# Patient Record
Sex: Male | Born: 1980 | Race: Black or African American | Hispanic: No | Marital: Married | Smoking: Never smoker
Health system: Southern US, Community
[De-identification: ages and names within clinical notes are randomized; demographics above are authoritative.]

## PROBLEM LIST (undated history)

## (undated) DIAGNOSIS — E669 Obesity, unspecified: Secondary | ICD-10-CM

## (undated) HISTORY — DX: Obesity, unspecified: E66.9

---

## 2009-11-28 ENCOUNTER — Emergency Department (HOSPITAL_COMMUNITY): Admission: EM | Admit: 2009-11-28 | Discharge: 2009-11-28 | Payer: Self-pay | Admitting: Emergency Medicine

## 2015-02-19 ENCOUNTER — Encounter (HOSPITAL_COMMUNITY): Payer: Self-pay | Admitting: Emergency Medicine

## 2015-02-19 ENCOUNTER — Emergency Department (HOSPITAL_COMMUNITY): Payer: 59

## 2015-02-19 ENCOUNTER — Emergency Department (HOSPITAL_COMMUNITY)
Admission: EM | Admit: 2015-02-19 | Discharge: 2015-02-19 | Disposition: A | Discharge status: Home / Self Care | Payer: 59 | Attending: Emergency Medicine | Admitting: Emergency Medicine

## 2015-02-19 DIAGNOSIS — R55 Syncope and collapse: Secondary | ICD-10-CM | POA: Diagnosis not present

## 2015-02-19 DIAGNOSIS — R42 Dizziness and giddiness: Secondary | ICD-10-CM | POA: Insufficient documentation

## 2015-02-19 DIAGNOSIS — R0602 Shortness of breath: Secondary | ICD-10-CM | POA: Diagnosis not present

## 2015-02-19 LAB — COMPREHENSIVE METABOLIC PANEL
ALT: 66 U/L — ABNORMAL HIGH (ref 17–63)
AST: 36 U/L (ref 15–41)
Albumin: 4.2 g/dL (ref 3.5–5.0)
Alkaline Phosphatase: 62 U/L (ref 38–126)
Anion gap: 7 (ref 5–15)
BUN: 11 mg/dL (ref 6–20)
CHLORIDE: 102 mmol/L (ref 101–111)
CO2: 30 mmol/L (ref 22–32)
Calcium: 9.7 mg/dL (ref 8.9–10.3)
Creatinine, Ser: 0.96 mg/dL (ref 0.61–1.24)
GFR calc Af Amer: >60 mL/min (ref >60)
GLUCOSE: 157 mg/dL — AB (ref 65–99)
POTASSIUM: 3.9 mmol/L (ref 3.5–5.1)
Sodium: 139 mmol/L (ref 135–145)
TOTAL PROTEIN: 7.9 g/dL (ref 6.5–8.1)
Total Bilirubin: 0.7 mg/dL (ref 0.3–1.2)

## 2015-02-19 LAB — CBC WITH DIFFERENTIAL/PLATELET
BASOS ABS: 0 10*3/uL (ref 0.0–0.1)
BASOS PCT: 0 % (ref 0–1)
Eosinophils Absolute: 0.1 10*3/uL (ref 0.0–0.7)
Eosinophils Relative: 1 % (ref 0–5)
HCT: 42.6 % (ref 39.0–52.0)
Hemoglobin: 14.2 g/dL (ref 13.0–17.0)
Lymphocytes Relative: 29 % (ref 12–46)
Lymphs Abs: 2 10*3/uL (ref 0.7–4.0)
MCH: 30.2 pg (ref 26.0–34.0)
MCHC: 33.3 g/dL (ref 30.0–36.0)
MCV: 90.6 fL (ref 78.0–100.0)
Monocytes Absolute: 0.4 10*3/uL (ref 0.1–1.0)
Monocytes Relative: 5 % (ref 3–12)
NEUTROS ABS: 4.5 10*3/uL (ref 1.7–7.7)
NEUTROS PCT: 65 % (ref 43–77)
PLATELETS: 237 10*3/uL (ref 150–400)
RBC: 4.7 MIL/uL (ref 4.22–5.81)
RDW: 13.3 % (ref 11.5–15.5)
WBC: 6.9 10*3/uL (ref 4.0–10.5)

## 2015-02-19 LAB — URINALYSIS, ROUTINE W REFLEX MICROSCOPIC
Bilirubin Urine: NEGATIVE
Glucose, UA: 250 mg/dL — AB
Hgb urine dipstick: NEGATIVE
KETONES UR: NEGATIVE mg/dL
Leukocytes, UA: NEGATIVE
Nitrite: NEGATIVE
Protein, ur: NEGATIVE mg/dL
Specific Gravity, Urine: 1.015 (ref 1.005–1.030)
Urobilinogen, UA: 0.2 mg/dL (ref 0.0–1.0)
pH: 5.5 (ref 5.0–8.0)

## 2015-02-19 LAB — TROPONIN I: Troponin I: <0.03 ng/mL (ref <0.031)

## 2015-02-19 MED ORDER — SODIUM CHLORIDE 0.9 % IV BOLUS (SEPSIS)
1000.0000 mL | Freq: Once | INTRAVENOUS | Status: AC
  Administered 2015-02-19: 1000 mL via INTRAVENOUS

## 2015-02-19 NOTE — ED Provider Notes (Signed)
CSN: [id number]     Arrival date & time 02/19/15  1450 History   First MD Initiated Contact with Patient 02/19/15 1510     Chief Complaint  Patient presents with  . Near Syncope  . Shortness of Breath     (Consider location/radiation/quality/duration/timing/severity/associated sxs/prior Treatment) Patient is a 34 y.o. male presenting with near-syncope and shortness of breath. The history is provided by the patient (the pt was on the camode having a bowel movement when he became dizzy diaphroretic and weak. he did not pass out).  Near Syncope This is a new problem. The current episode started 1 to 2 hours ago. The problem occurs rarely. The problem has been resolved. Pertinent negatives include no chest pain, no abdominal pain and no headaches. Nothing aggravates the symptoms. Nothing relieves the symptoms.  Shortness of Breath Associated symptoms: no abdominal pain, no chest pain, no cough, no headaches and no rash     History reviewed. No pertinent past medical history. History reviewed. No pertinent past surgical history. Family History  Problem Relation Age of Onset  . Hypertension Mother    Social History  Substance Use Topics  . Smoking status: None  . Smokeless tobacco: None  . Alcohol Use: Yes     Comment: occasionally    Review of Systems  Constitutional: Negative for appetite change and fatigue.  HENT: Negative for congestion, ear discharge and sinus pressure.   Eyes: Negative for discharge.  Respiratory: Negative for cough.   Cardiovascular: Positive for near-syncope. Negative for chest pain.  Gastrointestinal: Negative for abdominal pain and diarrhea.  Genitourinary: Negative for frequency and hematuria.  Musculoskeletal: Negative for back pain.  Skin: Negative for rash.  Neurological: Positive for light-headedness. Negative for seizures and headaches.  Psychiatric/Behavioral: Negative for hallucinations.      Allergies  Review of patient's allergies  indicates no known allergies.  Home Medications   Prior to Admission medications   Not on File   BP 112/48 mmHg  Pulse 86  Temp(Src) 98.4 F (36.9 C) (Oral)  Resp 15  Ht 6' (1.829 m)  Wt 365 lb (165.563 kg)  BMI 49.49 kg/m2  SpO2 99% Physical Exam  Constitutional: He is oriented to person, place, and time. He appears well-developed.  HENT:  Head: Normocephalic.  Eyes: Conjunctivae and EOM are normal. No scleral icterus.  Neck: Neck supple. No thyromegaly present.  Cardiovascular: Normal rate and regular rhythm.  Exam reveals no gallop and no friction rub.   No murmur heard. Pulmonary/Chest: No stridor. He has no wheezes. He has no rales. He exhibits no tenderness.  Abdominal: He exhibits no distension. There is no tenderness. There is no rebound.  Musculoskeletal: Normal range of motion. He exhibits no edema.  Lymphadenopathy:    He has no cervical adenopathy.  Neurological: He is oriented to person, place, and time. He exhibits normal muscle tone. Coordination normal.  Skin: No rash noted. No erythema.  Psychiatric: He has a normal mood and affect. His behavior is normal.    ED Course  Procedures (including critical care time) Labs Review Labs Reviewed  COMPREHENSIVE METABOLIC PANEL - Abnormal; Notable for the following:    Glucose, Bld 157 (*)    ALT 66 (*)    All other components within normal limits  URINALYSIS, ROUTINE W REFLEX MICROSCOPIC (NOT AT [facility]) - Abnormal; Notable for the following:    Glucose, UA 250 (*)    All other components within normal limits  CBC WITH DIFFERENTIAL/PLATELET  TROPONIN I  Imaging Review Dg Chest 2 View  02/19/2015   CLINICAL DATA:  Near syncopal episode during bowel movement today  EXAM: CHEST  2 VIEW  COMPARISON:  None  FINDINGS: Normal heart size, mediastinal contours, and pulmonary vascularity.  Lungs clear.  No pneumothorax.  Bones unremarkable.  IMPRESSION: Normal exam.   Electronically Signed   By: Lavonia Dana M.D.   On:  02/19/2015 16:17     EKG Interpretation   Date/Time:  Wednesday February 19 2015 15:04:54 EDT Ventricular Rate:  83 PR Interval:  204 QRS Duration: 103 QT Interval:  355 QTC Calculation: 417 R Axis:   -27 Text Interpretation:  Sinus rhythm Borderline prolonged PR interval  Borderline left axis deviation nonspecific ST changes pvcs No old tracing  to compare Confirmed by CAMPOS  MD, Lennette Bihari ([id number]) on 02/19/2015 3:29:11 PM      MDM   Final diagnoses:  Near syncope    Near syncope with bowel movement,  Nl labs,  Pt hydrated and feeling better.  Doubt cardiac or pe,  Will refer to pcp for mild elevated glucose and unifocal pvc    Milton Ferguson, MD 02/19/15 [phone]

## 2015-02-19 NOTE — Discharge Instructions (Signed)
Follow up with a family md for recheck in 1-3 weeks

## 2015-02-19 NOTE — ED Notes (Signed)
Per EMS patient states patient was on the toilet and had a near syncopal episode with diaphoresis and weakness. Patient states nausea before episode. After episode patient reported shortness of breath and weakness.

## 2015-02-28 ENCOUNTER — Encounter (HOSPITAL_COMMUNITY): Payer: Self-pay

## 2015-02-28 ENCOUNTER — Emergency Department (HOSPITAL_COMMUNITY)
Admission: EM | Admit: 2015-02-28 | Discharge: 2015-03-01 | Disposition: A | Discharge status: Home / Self Care | Payer: 59 | Attending: Emergency Medicine | Admitting: Emergency Medicine

## 2015-02-28 DIAGNOSIS — R531 Weakness: Secondary | ICD-10-CM

## 2015-02-28 DIAGNOSIS — R42 Dizziness and giddiness: Secondary | ICD-10-CM | POA: Insufficient documentation

## 2015-02-28 DIAGNOSIS — R110 Nausea: Secondary | ICD-10-CM | POA: Insufficient documentation

## 2015-02-28 DIAGNOSIS — R5383 Other fatigue: Secondary | ICD-10-CM | POA: Insufficient documentation

## 2015-02-28 NOTE — ED Notes (Signed)
Pt c/o generalized body weakness, shortness of breath, dizziness and nausea.

## 2015-02-28 NOTE — ED Provider Notes (Signed)
CSN: [id number]     Arrival date & time 02/28/15  2313 History  This chart was scribed for Jola Schmidt, MD by Irene Pap, ED Scribe. This patient was seen in room APA04/APA04 and patient care was started at 11:39 PM.   Chief Complaint  Patient presents with  . Weakness   The history is provided by the patient. No language interpreter was used.   HPI Comments: Kyle Nguyen is a 34 y.o. male with a hx of HTN who presents to the Emergency Department complaining of generalized weakness onset earlier this evening. He states that he ate dinner then went to the bathroom where he began to feel dizziness, lightheadedness and SOB 5 minutes after using the bathroom; he states that the episode lasted 10 minutes. Reports associated lethargy and nausea. He states that he was seen 2 weeks ago for the same symptoms in the same situation: after using the bathroom. He says that he had blood work and x-rays performed last time but states that the symptoms resolved and did not show up again until today. Denies that he felt bad earlier, fever, vomiting, chest pain, chest tightness, diarrhea, melena, or hematochezia. States that he works in a [employer] and is not out in the heat while working.  History reviewed. No pertinent past medical history. History reviewed. No pertinent past surgical history. Family History  Problem Relation Age of Onset  . Hypertension Mother    Social History  Substance Use Topics  . Smoking status: Never Smoker   . Smokeless tobacco: None  . Alcohol Use: Yes     Comment: occasionally    Review of Systems  Constitutional: Positive for fatigue. Negative for fever.  Gastrointestinal: Positive for nausea. Negative for vomiting, diarrhea and blood in stool.  Neurological: Positive for dizziness, weakness and light-headedness.  All other systems reviewed and are negative.     Allergies  Review of patient's allergies indicates no known allergies.  Home Medications    Prior to Admission medications   Not on File   BP 125/71 mmHg  Pulse 72  Temp(Src) 98.3 F (36.8 C) (Oral)  Resp 19  Ht 6' (1.829 m)  Wt 360 lb (163.295 kg)  BMI 48.81 kg/m2  SpO2 100%  Physical Exam  Constitutional: He is oriented to person, place, and time. He appears well-developed and well-nourished.  HENT:  Head: Normocephalic and atraumatic.  Right Ear: External ear normal.  Left Ear: External ear normal.  Nose: Nose normal.  Mouth/Throat: Oropharynx is clear and moist.  Eyes: Conjunctivae and EOM are normal. Pupils are equal, round, and reactive to light.  Neck: Normal range of motion. Neck supple.  Cardiovascular: Normal rate, regular rhythm, normal heart sounds and intact distal pulses.   Pulmonary/Chest: Effort normal and breath sounds normal.  Abdominal: Soft. Bowel sounds are normal.  Musculoskeletal: Normal range of motion.  Neurological: He is alert and oriented to person, place, and time. He has normal reflexes.  Skin: Skin is warm and dry.  Psychiatric: He has a normal mood and affect. His behavior is normal. Judgment and thought content normal.  Nursing note and vitals reviewed.   ED Course  Procedures (including critical care time) DIAGNOSTIC STUDIES: Oxygen Saturation is 100% on RA, normal by my interpretation.    COORDINATION OF CARE: 11:42 PM-Discussed treatment plan which includes labs with pt at bedside and pt agreed to plan.   Labs Review Labs Reviewed  CBC WITH DIFFERENTIAL/PLATELET - Abnormal; Notable for the following:  Hemoglobin 12.9 (*)    HCT 38.3 (*)    All other components within normal limits  BASIC METABOLIC PANEL - Abnormal; Notable for the following:    Glucose, Bld 105 (*)    Calcium 8.8 (*)    All other components within normal limits    Imaging Review No results found.   EKG Interpretation   Date/Time:  Friday February 28 2015 23:20:01 EDT Ventricular Rate:  69 PR Interval:  212 QRS Duration: 112 QT Interval:   376 QTC Calculation: 403 R Axis:   -32 Text Interpretation:  Sinus rhythm Prolonged PR interval Borderline IVCD  with LAD ST elev, probable normal early repol pattern No significant  change was found Confirmed by Kden Wagster  MD, Daymeon Fischman ([id number]) on 02/28/2015  11:52:17 PM      MDM   Final diagnoses:  None    Generalized weakness.  No preceding palpitations or chest pain.  EKG is nonischemic.  No syncope.  Patient will need primary care follow-up.  Labs without significant abnormality.  Discharge home in good condition.  Symptoms have completely resolved at this time.   I personally performed the services described in this documentation, which was scribed in my presence. The recorded information has been reviewed and is accurate.      Jola Schmidt, MD 03/01/15 [phone]

## 2015-03-01 LAB — BASIC METABOLIC PANEL
ANION GAP: 7 (ref 5–15)
BUN: 14 mg/dL (ref 6–20)
CO2: 28 mmol/L (ref 22–32)
Calcium: 8.8 mg/dL — ABNORMAL LOW (ref 8.9–10.3)
Chloride: 102 mmol/L (ref 101–111)
Creatinine, Ser: 1.06 mg/dL (ref 0.61–1.24)
GFR calc Af Amer: >60 mL/min (ref >60)
GFR calc non Af Amer: >60 mL/min (ref >60)
GLUCOSE: 105 mg/dL — AB (ref 65–99)
Potassium: 3.8 mmol/L (ref 3.5–5.1)
Sodium: 137 mmol/L (ref 135–145)

## 2015-03-01 LAB — CBC WITH DIFFERENTIAL/PLATELET
BASOS ABS: 0 10*3/uL (ref 0.0–0.1)
Basophils Relative: 0 % (ref 0–1)
Eosinophils Absolute: 0.1 10*3/uL (ref 0.0–0.7)
Eosinophils Relative: 2 % (ref 0–5)
HEMATOCRIT: 38.3 % — AB (ref 39.0–52.0)
Hemoglobin: 12.9 g/dL — ABNORMAL LOW (ref 13.0–17.0)
Lymphocytes Relative: 36 % (ref 12–46)
Lymphs Abs: 2.5 10*3/uL (ref 0.7–4.0)
MCH: 30.4 pg (ref 26.0–34.0)
MCHC: 33.7 g/dL (ref 30.0–36.0)
MCV: 90.3 fL (ref 78.0–100.0)
Monocytes Absolute: 0.5 10*3/uL (ref 0.1–1.0)
Monocytes Relative: 8 % (ref 3–12)
NEUTROS ABS: 3.8 10*3/uL (ref 1.7–7.7)
Neutrophils Relative %: 54 % (ref 43–77)
Platelets: 262 10*3/uL (ref 150–400)
RBC: 4.24 MIL/uL (ref 4.22–5.81)
RDW: 13.5 % (ref 11.5–15.5)
WBC: 6.9 10*3/uL (ref 4.0–10.5)

## 2015-03-03 ENCOUNTER — Encounter (HOSPITAL_COMMUNITY): Payer: Self-pay | Admitting: Emergency Medicine

## 2015-03-03 DIAGNOSIS — R0602 Shortness of breath: Secondary | ICD-10-CM | POA: Diagnosis not present

## 2015-03-03 DIAGNOSIS — R112 Nausea with vomiting, unspecified: Secondary | ICD-10-CM | POA: Diagnosis not present

## 2015-03-03 DIAGNOSIS — R079 Chest pain, unspecified: Secondary | ICD-10-CM | POA: Diagnosis not present

## 2015-03-03 DIAGNOSIS — I10 Essential (primary) hypertension: Secondary | ICD-10-CM | POA: Diagnosis not present

## 2015-03-03 DIAGNOSIS — R197 Diarrhea, unspecified: Secondary | ICD-10-CM | POA: Insufficient documentation

## 2015-03-03 NOTE — ED Notes (Signed)
Pt reports sob, L upper cp and nv x1. sts he has been seen for same over past 2 weeks. Skin warm and dry. resp e/u.

## 2015-03-04 ENCOUNTER — Emergency Department (HOSPITAL_COMMUNITY)
Admission: EM | Admit: 2015-03-04 | Discharge: 2015-03-04 | Disposition: A | Discharge status: Home / Self Care | Payer: 59 | Attending: Emergency Medicine | Admitting: Emergency Medicine

## 2015-03-04 ENCOUNTER — Emergency Department (HOSPITAL_COMMUNITY): Payer: 59

## 2015-03-04 DIAGNOSIS — R112 Nausea with vomiting, unspecified: Secondary | ICD-10-CM

## 2015-03-04 DIAGNOSIS — R079 Chest pain, unspecified: Secondary | ICD-10-CM

## 2015-03-04 LAB — CBC
HEMATOCRIT: 41.6 % (ref 39.0–52.0)
HEMOGLOBIN: 14.3 g/dL (ref 13.0–17.0)
MCH: 30.5 pg (ref 26.0–34.0)
MCHC: 34.4 g/dL (ref 30.0–36.0)
MCV: 88.7 fL (ref 78.0–100.0)
Platelets: 288 10*3/uL (ref 150–400)
RBC: 4.69 MIL/uL (ref 4.22–5.81)
RDW: 13.5 % (ref 11.5–15.5)
WBC: 6.7 10*3/uL (ref 4.0–10.5)

## 2015-03-04 LAB — BASIC METABOLIC PANEL
Anion gap: 10 (ref 5–15)
BUN: 11 mg/dL (ref 6–20)
CO2: 23 mmol/L (ref 22–32)
Calcium: 9.8 mg/dL (ref 8.9–10.3)
Chloride: 104 mmol/L (ref 101–111)
Creatinine, Ser: 0.93 mg/dL (ref 0.61–1.24)
GLUCOSE: 92 mg/dL (ref 65–99)
POTASSIUM: 3.9 mmol/L (ref 3.5–5.1)
Sodium: 137 mmol/L (ref 135–145)

## 2015-03-04 LAB — D-DIMER, QUANTITATIVE (NOT AT ARMC)

## 2015-03-04 LAB — TROPONIN I: Troponin I: <0.03 ng/mL (ref <0.031)

## 2015-03-04 NOTE — ED Notes (Signed)
Dr. Wickline at bedside.  

## 2015-03-04 NOTE — Discharge Instructions (Signed)

## 2015-03-04 NOTE — ED Provider Notes (Signed)
CSN: [id number]   Arrival date & time 03/03/15 2351  History  This chart was scribed for Kyle Fraise, MD by Altamease Oiler, ED Scribe. This patient was seen in room A10C/A10C and the patient's care was started at 3:17 AM.  Chief Complaint  Patient presents with  . Shortness of Breath  . Nausea    HPI Patient is a 34 y.o. male presenting with chest pain. The history is provided by the patient. No language interpreter was used.  Chest Pain Pain location:  L chest Pain radiates to:  Does not radiate Pain radiates to the back: no   Pain severity:  Moderate Onset quality:  Sudden Duration:  30 minutes Progression:  Resolved Chronicity:  New Context: not breathing   Relieved by:  Nothing Worsened by:  Nothing tried Ineffective treatments:  None tried Associated symptoms: nausea, shortness of breath and vomiting   Associated symptoms: no abdominal pain, no dizziness, no fever, no near-syncope and no syncope    Atthew Nguyen is a 34 y.o. male with PMHx of HTN who presents to the Emergency Department complaining of an episode of left-sided chest pain with onset around 10 PM.  He describes the pain as "like swimming under water". The episode lasted for 30 minutes and has not re-occurred. Associated symptoms include SOB, nausea (with onset around 9 PM prior to the onset of pain) , 1 episode of emesis, and diarrhea. Pt denies pleuritic chest pain, dizziness, syncope, hematemesis, hematochezia, syncope, and abdominal pain. No daily medication. No history of DVT/PE or MI. No recent travel or surgery. No family history of early unexplained death. He has a scheduled PCP appointment on 03/11/15.  PMH - none Family History  Problem Relation Age of Onset  . Hypertension Mother     Social History  Substance Use Topics  . Smoking status: Never Smoker   . Smokeless tobacco: None  . Alcohol Use: Yes     Comment: occasionally     Review of Systems  Constitutional: Negative for fever.   Respiratory: Positive for shortness of breath.   Cardiovascular: Positive for chest pain. Negative for syncope and near-syncope.  Gastrointestinal: Positive for nausea and vomiting. Negative for abdominal pain.  Neurological: Negative for dizziness.  All other systems reviewed and are negative.   Home Medications   Prior to Admission medications   Not on File    Allergies  Review of patient's allergies indicates no known allergies.  Triage Vitals: BP 129/51 mmHg  Pulse 62  Temp(Src) 98.7 F (37.1 C) (Oral)  Resp 12  Ht 6' (1.829 m)  Wt 363 lb (164.656 kg)  BMI 49.22 kg/m2  SpO2 94%  Physical Exam  Nursing note and vitals reviewed. CONSTITUTIONAL: Well developed/well nourished HEAD: Normocephalic/atraumatic EYES: EOMI/PERRL ENMT: Mucous membranes moist NECK: supple no meningeal signs SPINE/BACK:entire spine nontender CV: S1/S2 noted, no murmurs/rubs/gallops noted LUNGS: Lungs are clear to auscultation bilaterally, no apparent distress ABDOMEN: soft, nontender, no rebound or guarding, bowel sounds noted throughout abdomen GU:no cva tenderness NEURO: Pt is awake/alert/appropriate, moves all extremitiesx4.  No facial droop.   EXTREMITIES: pulses normal/equal, full ROM SKIN: warm, color normal PSYCH: no abnormalities of mood noted, alert and oriented to situation  ED Course  Procedures   DIAGNOSTIC STUDIES: Oxygen Saturation is 94% on RA, normal by my interpretation.    COORDINATION OF CARE: 3:27 AM Discussed treatment plan which includes CXR, EKG, and lab work with pt at bedside and pt agreed to plan.  Pt well appearing He has  had multiple ED visits, but with different complaints (had near syncope on previous visit) His CP was several hours prior with negative troponin D-dimer negative I doubt ACS/PE/Dissection at this time I feel he is safe for d/c home He has no high risk features for dysrhythmia He has PCP followup Discussed strict return  precautions   Labs Reviewed  BASIC METABOLIC PANEL  CBC  TROPONIN I  D-DIMER, QUANTITATIVE (NOT AT [facility])    I, Kyle Fraise, MD, personally reviewed and evaluated these images and lab results as part of my medical decision-making.  Imaging Review Dg Chest 2 View  03/04/2015   CLINICAL DATA:  Mid chest pain and shortness of breath. Nausea and vomiting. Symptoms for 1 day.  EXAM: CHEST  2 VIEW  COMPARISON:  02/19/2015  FINDINGS: The cardiomediastinal contours are normal. The lungs are clear. Pulmonary vasculature is normal. No consolidation, pleural effusion, or pneumothorax. No acute osseous abnormalities are seen.  IMPRESSION: No acute pulmonary process.   Electronically Signed   By: Jeb Levering M.D.   On: 03/04/2015 01:02    EKG Interpretation  Date/Time:  Monday March 03 2015 23:58:04 EDT Ventricular Rate:  76 PR Interval:  194 QRS Duration: 98 QT Interval:  364 QTC Calculation: 409 R Axis:   7 Text Interpretation:  Normal sinus rhythm Incomplete right bundle branch block Nonspecific T wave abnormality Abnormal ECG No significant change since last tracing Confirmed by Christy Gentles  MD, Elenore Rota ([id number]) on 03/04/2015 2:31:55 AM       MDM   Final diagnoses:  Chest pain, unspecified chest pain type  Non-intractable vomiting with nausea, vomiting of unspecified type     Nursing notes including past medical history and social history reviewed and considered in documentation xrays/imaging reviewed by myself and considered during evaluation Labs/vital reviewed myself and considered during evaluation Previous records reviewed and considered   I personally performed the services described in this documentation, which was scribed in my presence. The recorded information has been reviewed and is accurate.     Kyle Fraise, MD 03/04/15 [phone]

## 2015-03-04 NOTE — ED Notes (Signed)
Discharge instructions reviewed with patient/spouse. Understanding verbalized. Patient refused wheelchair for transport at time of discharge. Denies pain. No distress noted.

## 2015-03-04 NOTE — ED Notes (Signed)
Phlebotomy at bedside.

## 2015-03-25 ENCOUNTER — Other Ambulatory Visit: Payer: Self-pay | Admitting: Internal Medicine

## 2015-03-25 DIAGNOSIS — R945 Abnormal results of liver function studies: Principal | ICD-10-CM

## 2015-03-25 DIAGNOSIS — R7989 Other specified abnormal findings of blood chemistry: Secondary | ICD-10-CM

## 2015-03-28 ENCOUNTER — Ambulatory Visit
Admission: RE | Admit: 2015-03-28 | Discharge: 2015-03-28 | Disposition: A | Discharge status: Home / Self Care | Payer: 59 | Source: Ambulatory Visit | Attending: Internal Medicine | Admitting: Internal Medicine

## 2015-03-28 DIAGNOSIS — R7989 Other specified abnormal findings of blood chemistry: Secondary | ICD-10-CM

## 2015-03-28 DIAGNOSIS — R945 Abnormal results of liver function studies: Principal | ICD-10-CM

## 2015-07-28 ENCOUNTER — Emergency Department (HOSPITAL_COMMUNITY)
Admission: EM | Admit: 2015-07-28 | Discharge: 2015-07-29 | Disposition: A | Discharge status: Home / Self Care | Payer: 59 | Attending: Emergency Medicine | Admitting: Emergency Medicine

## 2015-07-28 ENCOUNTER — Encounter (HOSPITAL_COMMUNITY): Payer: Self-pay | Admitting: Emergency Medicine

## 2015-07-28 DIAGNOSIS — R112 Nausea with vomiting, unspecified: Secondary | ICD-10-CM | POA: Diagnosis present

## 2015-07-28 DIAGNOSIS — R1084 Generalized abdominal pain: Secondary | ICD-10-CM | POA: Diagnosis not present

## 2015-07-28 NOTE — ED Notes (Signed)
Patient brought in by EMS with complaint of abdominal pain and vomiting starting at 1830 tonight.

## 2015-07-29 ENCOUNTER — Emergency Department (HOSPITAL_COMMUNITY): Payer: 59

## 2015-07-29 LAB — COMPREHENSIVE METABOLIC PANEL
ALK PHOS: 68 U/L (ref 38–126)
ALT: 52 U/L (ref 17–63)
AST: 44 U/L — ABNORMAL HIGH (ref 15–41)
Albumin: 4.6 g/dL (ref 3.5–5.0)
Anion gap: 13 (ref 5–15)
BILIRUBIN TOTAL: 0.9 mg/dL (ref 0.3–1.2)
BUN: 14 mg/dL (ref 6–20)
CALCIUM: 9.7 mg/dL (ref 8.9–10.3)
CO2: 24 mmol/L (ref 22–32)
CREATININE: 1.08 mg/dL (ref 0.61–1.24)
Chloride: 102 mmol/L (ref 101–111)
Glucose, Bld: 136 mg/dL — ABNORMAL HIGH (ref 65–99)
Potassium: 3.3 mmol/L — ABNORMAL LOW (ref 3.5–5.1)
Sodium: 139 mmol/L (ref 135–145)
TOTAL PROTEIN: 8.6 g/dL — AB (ref 6.5–8.1)

## 2015-07-29 LAB — CBC WITH DIFFERENTIAL/PLATELET
BASOS ABS: 0 10*3/uL (ref 0.0–0.1)
Basophils Relative: 0 %
EOS PCT: 0 %
Eosinophils Absolute: 0 10*3/uL (ref 0.0–0.7)
HEMATOCRIT: 44.9 % (ref 39.0–52.0)
HEMOGLOBIN: 14.9 g/dL (ref 13.0–17.0)
LYMPHS PCT: 23 %
Lymphs Abs: 1.9 10*3/uL (ref 0.7–4.0)
MCH: 29.4 pg (ref 26.0–34.0)
MCHC: 33.2 g/dL (ref 30.0–36.0)
MCV: 88.7 fL (ref 78.0–100.0)
Monocytes Absolute: 0.5 10*3/uL (ref 0.1–1.0)
Monocytes Relative: 6 %
NEUTROS ABS: 5.6 10*3/uL (ref 1.7–7.7)
Neutrophils Relative %: 71 %
Platelets: 248 10*3/uL (ref 150–400)
RBC: 5.06 MIL/uL (ref 4.22–5.81)
RDW: 13.5 % (ref 11.5–15.5)
WBC: 8.1 10*3/uL (ref 4.0–10.5)

## 2015-07-29 LAB — URINALYSIS, ROUTINE W REFLEX MICROSCOPIC
BILIRUBIN URINE: NEGATIVE
Glucose, UA: NEGATIVE mg/dL
KETONES UR: NEGATIVE mg/dL
Leukocytes, UA: NEGATIVE
Nitrite: NEGATIVE
PROTEIN: 30 mg/dL — AB
SPECIFIC GRAVITY, URINE: 1.025 (ref 1.005–1.030)
pH: 6.5 (ref 5.0–8.0)

## 2015-07-29 LAB — URINE MICROSCOPIC-ADD ON

## 2015-07-29 LAB — LIPASE, BLOOD: LIPASE: 14 U/L (ref 11–51)

## 2015-07-29 MED ORDER — ONDANSETRON HCL 4 MG/2ML IJ SOLN
4.0000 mg | Freq: Once | INTRAMUSCULAR | Status: AC
  Administered 2015-07-29: 4 mg via INTRAVENOUS
  Filled 2015-07-29: qty 2

## 2015-07-29 MED ORDER — ONDANSETRON HCL 4 MG PO TABS: 4.0000 mg | ORAL_TABLET | Freq: Four times a day (QID) | ORAL | Status: DC

## 2015-07-29 MED ORDER — DIPHENOXYLATE-ATROPINE 2.5-0.025 MG PO TABS: 2.0000 | ORAL_TABLET | Freq: Four times a day (QID) | ORAL | Status: DC | PRN

## 2015-07-29 MED ORDER — SODIUM CHLORIDE 0.9 % IV BOLUS (SEPSIS)
1000.0000 mL | Freq: Once | INTRAVENOUS | Status: AC
  Administered 2015-07-29: 1000 mL via INTRAVENOUS

## 2015-07-29 NOTE — Discharge Instructions (Signed)
Abdominal Pain, Adult °Many things can cause abdominal pain. Usually, abdominal pain is not caused by a disease and will improve without treatment. It can often be observed and treated at home. Your health care provider will do a physical exam and possibly order blood tests and X-rays to help determine the seriousness of your pain. However, in many cases, more time must pass before a clear cause of the pain can be found. Before that point, your health care provider may not know if you need more testing or further treatment. °HOME CARE INSTRUCTIONS °Monitor your abdominal pain for any changes. The following actions may help to alleviate any discomfort you are experiencing: °· Only take over-the-counter or prescription medicines as directed by your health care provider. °· Do not take laxatives unless directed to do so by your health care provider. °· Try a clear liquid diet (broth, tea, or water) as directed by your health care provider. Slowly move to a bland diet as tolerated. °SEEK MEDICAL CARE IF: °· You have unexplained abdominal pain. °· You have abdominal pain associated with nausea or diarrhea. °· You have pain when you urinate or have a bowel movement. °· You experience abdominal pain that wakes you in the night. °· You have abdominal pain that is worsened or improved by eating food. °· You have abdominal pain that is worsened with eating fatty foods. °· You have a fever. °SEEK IMMEDIATE MEDICAL CARE IF: °· Your pain does not go away within 2 hours. °· You keep throwing up (vomiting). °· Your pain is felt only in portions of the abdomen, such as the right side or the left lower portion of the abdomen. °· You pass bloody or black tarry stools. °MAKE SURE YOU: °· Understand these instructions. °· Will watch your condition. °· Will get help right away if you are not doing well or get worse. °  °This information is not intended to replace advice given to you by your health care provider. Make sure you discuss  any questions you have with your health care provider. °  °Document Released: 04/07/2005 Document Revised: 03/19/2015 Document Reviewed: 03/07/2013 °Elsevier Interactive Patient Education ©2016 [employer] ° °Nausea and Vomiting °Nausea is a sick feeling that often comes before throwing up (vomiting). Vomiting is a reflex where stomach contents come out of your mouth. Vomiting can cause severe loss of body fluids (dehydration). Children and elderly adults can become dehydrated quickly, especially if they also have diarrhea. Nausea and vomiting are symptoms of a condition or disease. It is important to find the cause of your symptoms. °CAUSES  °· Direct irritation of the stomach lining. This irritation can result from increased acid production (gastroesophageal reflux disease), infection, food poisoning, taking certain medicines (such as nonsteroidal anti-inflammatory drugs), alcohol use, or tobacco use. °· Signals from the brain. These signals could be caused by a headache, heat exposure, an inner ear disturbance, increased pressure in the brain from injury, infection, a tumor, or a concussion, pain, emotional stimulus, or metabolic problems. °· An obstruction in the gastrointestinal tract (bowel obstruction). °· Illnesses such as diabetes, hepatitis, gallbladder problems, appendicitis, kidney problems, cancer, sepsis, atypical symptoms of a heart attack, or eating disorders. °· Medical treatments such as chemotherapy and radiation. °· Receiving medicine that makes you sleep (general anesthetic) during surgery. °DIAGNOSIS °Your caregiver may ask for tests to be done if the problems do not improve after a few days. Tests may also be done if symptoms are severe or if the reason for the   nausea and vomiting is not clear. Tests may include: °· Urine tests. °· Blood tests. °· Stool tests. °· Cultures (to look for evidence of infection). °· X-rays or other imaging studies. °Test results can help your caregiver make  decisions about treatment or the need for additional tests. °TREATMENT °You need to stay well hydrated. Drink frequently but in small amounts. You may wish to drink water, sports drinks, clear broth, or eat frozen ice pops or gelatin dessert to help stay hydrated. When you eat, eating slowly may help prevent nausea. There are also some antinausea medicines that may help prevent nausea. °HOME CARE INSTRUCTIONS  °· Take all medicine as directed by your caregiver. °· If you do not have an appetite, do not force yourself to eat. However, you must continue to drink fluids. °· If you have an appetite, eat a normal diet unless your caregiver tells you differently. °¨ Eat a variety of complex carbohydrates (rice, wheat, potatoes, bread), lean meats, yogurt, fruits, and vegetables. °¨ Avoid high-fat foods because they are more difficult to digest. °· Drink enough water and fluids to keep your urine clear or pale yellow. °· If you are dehydrated, ask your caregiver for specific rehydration instructions. Signs of dehydration may include: °¨ Severe thirst. °¨ Dry lips and mouth. °¨ Dizziness. °¨ Dark urine. °¨ Decreasing urine frequency and amount. °¨ Confusion. °¨ Rapid breathing or pulse. °SEEK IMMEDIATE MEDICAL CARE IF:  °· You have blood or brown flecks (like coffee grounds) in your vomit. °· You have black or bloody stools. °· You have a severe headache or stiff neck. °· You are confused. °· You have severe abdominal pain. °· You have chest pain or trouble breathing. °· You do not urinate at least once every 8 hours. °· You develop cold or clammy skin. °· You continue to vomit for longer than 24 to 48 hours. °· You have a fever. °MAKE SURE YOU:  °· Understand these instructions. °· Will watch your condition. °· Will get help right away if you are not doing well or get worse. °  °This information is not intended to replace advice given to you by your health care provider. Make sure you discuss any questions you have with  your health care provider. °  °Document Released: 06/28/2005 Document Revised: 09/20/2011 Document Reviewed: 11/25/2010 °Elsevier Interactive Patient Education ©2016 [employer] ° °

## 2015-07-29 NOTE — ED Provider Notes (Signed)
CSN: [id number]     Arrival date & time 07/28/15  2327 History  By signing my name below, I, [employer] [facility], attest that this documentation has been prepared under the direction and in the presence of Orpah Greek, MD. Electronically Signed: Virgel Bouquet, ED Scribe. 07/29/2015. 12:21 AM.   Chief Complaint  Patient presents with  . Abdominal Pain  . Emesis   The history is provided by the patient. No language interpreter was used.    HPI Comments: Kyle Nguyen is a 35 y.o. male with no hx of pertinent chronic conditions who presents to the Emergency Department complaining of moderate, unchanging, generalized aching abdominal pain that began 5.5 hours ago. He endorses associated nausea and vomiting. He denies cough, sore throat, sinus congestion, and diarrhea.  History reviewed. No pertinent past medical history. History reviewed. No pertinent past surgical history. Family History  Problem Relation Age of Onset  . Hypertension Mother    Social History  Substance Use Topics  . Smoking status: Never Smoker   . Smokeless tobacco: None  . Alcohol Use: Yes     Comment: occasionally    Review of Systems  HENT: Negative for congestion and sore throat.   Respiratory: Negative for cough.   Gastrointestinal: Positive for nausea, vomiting and abdominal pain. Negative for diarrhea.  All other systems reviewed and are negative.     Allergies  Review of patient's allergies indicates no known allergies.  Home Medications   Prior to Admission medications   Not on File   BP 118/45 mmHg  Pulse 120  Temp(Src) 97.5 F (36.4 C) (Oral)  Resp 18  Ht 6' (1.829 m)  Wt 350 lb (158.759 kg)  BMI 47.46 kg/m2  SpO2 100% Physical Exam  Constitutional: He is oriented to person, place, and time. He appears well-developed and well-nourished. No distress.  HENT:  Head: Normocephalic and atraumatic.  Right Ear: Hearing normal.  Left Ear: Hearing normal.  Nose: Nose normal.   Mouth/Throat: Oropharynx is clear and moist and mucous membranes are normal.  Eyes: Conjunctivae and EOM are normal. Pupils are equal, round, and reactive to light.  Neck: Normal range of motion. Neck supple.  Cardiovascular: Regular rhythm, S1 normal and S2 normal.  Exam reveals no gallop and no friction rub.   No murmur heard. Pulmonary/Chest: Effort normal and breath sounds normal. No respiratory distress. He exhibits no tenderness.  Abdominal: Soft. Normal appearance and bowel sounds are normal. There is no hepatosplenomegaly. There is tenderness. There is no rebound, no guarding, no tenderness at McBurney's point and negative Murphy's sign. No hernia.  Mild diffuse abdominal tenderness.  Musculoskeletal: Normal range of motion.  Neurological: He is alert and oriented to person, place, and time. He has normal strength. No cranial nerve deficit or sensory deficit. Coordination normal. GCS eye subscore is 4. GCS verbal subscore is 5. GCS motor subscore is 6.  Skin: Skin is warm, dry and intact. No rash noted. No cyanosis.  Psychiatric: He has a normal mood and affect. His speech is normal and behavior is normal. Thought content normal.  Nursing note and vitals reviewed.   ED Course  Procedures   DIAGNOSTIC STUDIES: Oxygen Saturation is 100% on RA, normal by my interpretation.    COORDINATION OF CARE: 12:03 AM Will adminster IV fluids and order chest x-ray and labs. Discussed treatment plan with pt at bedside and pt agreed to plan.  Labs Review Labs Reviewed  CBC WITH DIFFERENTIAL/PLATELET  COMPREHENSIVE METABOLIC PANEL  LIPASE, BLOOD  URINALYSIS, ROUTINE W REFLEX MICROSCOPIC (NOT AT [facility])    Imaging Review No results found. I have personally reviewed and evaluated these images and lab results as part of my medical decision-making.   EKG Interpretation None      MDM   Final diagnoses:  None   abdominal pain Vomiting  Patient presents to the emergency department for  evaluation of acute onset nausea, vomiting with abdominal pain. Patient has a benign exam, abdominal exam does not suggest an acute surgical process. Lab work is unremarkable. Patient feeling much better after symptomatic treatment. He is appropriate for outpatient management, return if symptoms worsen.  I personally performed the services described in this documentation, which was scribed in my presence. The recorded information has been reviewed and is accurate.    Orpah Greek, MD 07/29/15 [phone]

## 2016-09-29 DIAGNOSIS — Z713 Dietary counseling and surveillance: Secondary | ICD-10-CM | POA: Diagnosis not present

## 2016-09-29 DIAGNOSIS — K760 Fatty (change of) liver, not elsewhere classified: Secondary | ICD-10-CM | POA: Diagnosis not present

## 2017-04-01 DIAGNOSIS — E785 Hyperlipidemia, unspecified: Secondary | ICD-10-CM | POA: Diagnosis not present

## 2017-04-01 DIAGNOSIS — Z0000 Encounter for general adult medical examination without abnormal findings: Secondary | ICD-10-CM | POA: Diagnosis not present

## 2017-04-01 DIAGNOSIS — R7303 Prediabetes: Secondary | ICD-10-CM | POA: Diagnosis not present

## 2017-06-30 IMAGING — DX DG CHEST 2V
2 series · 2 of 2 positions shown · non-contrast
Comparison: None

CLINICAL DATA: Near syncopal episode during bowel movement today

EXAM:
CHEST  2 VIEW

[chest pa]
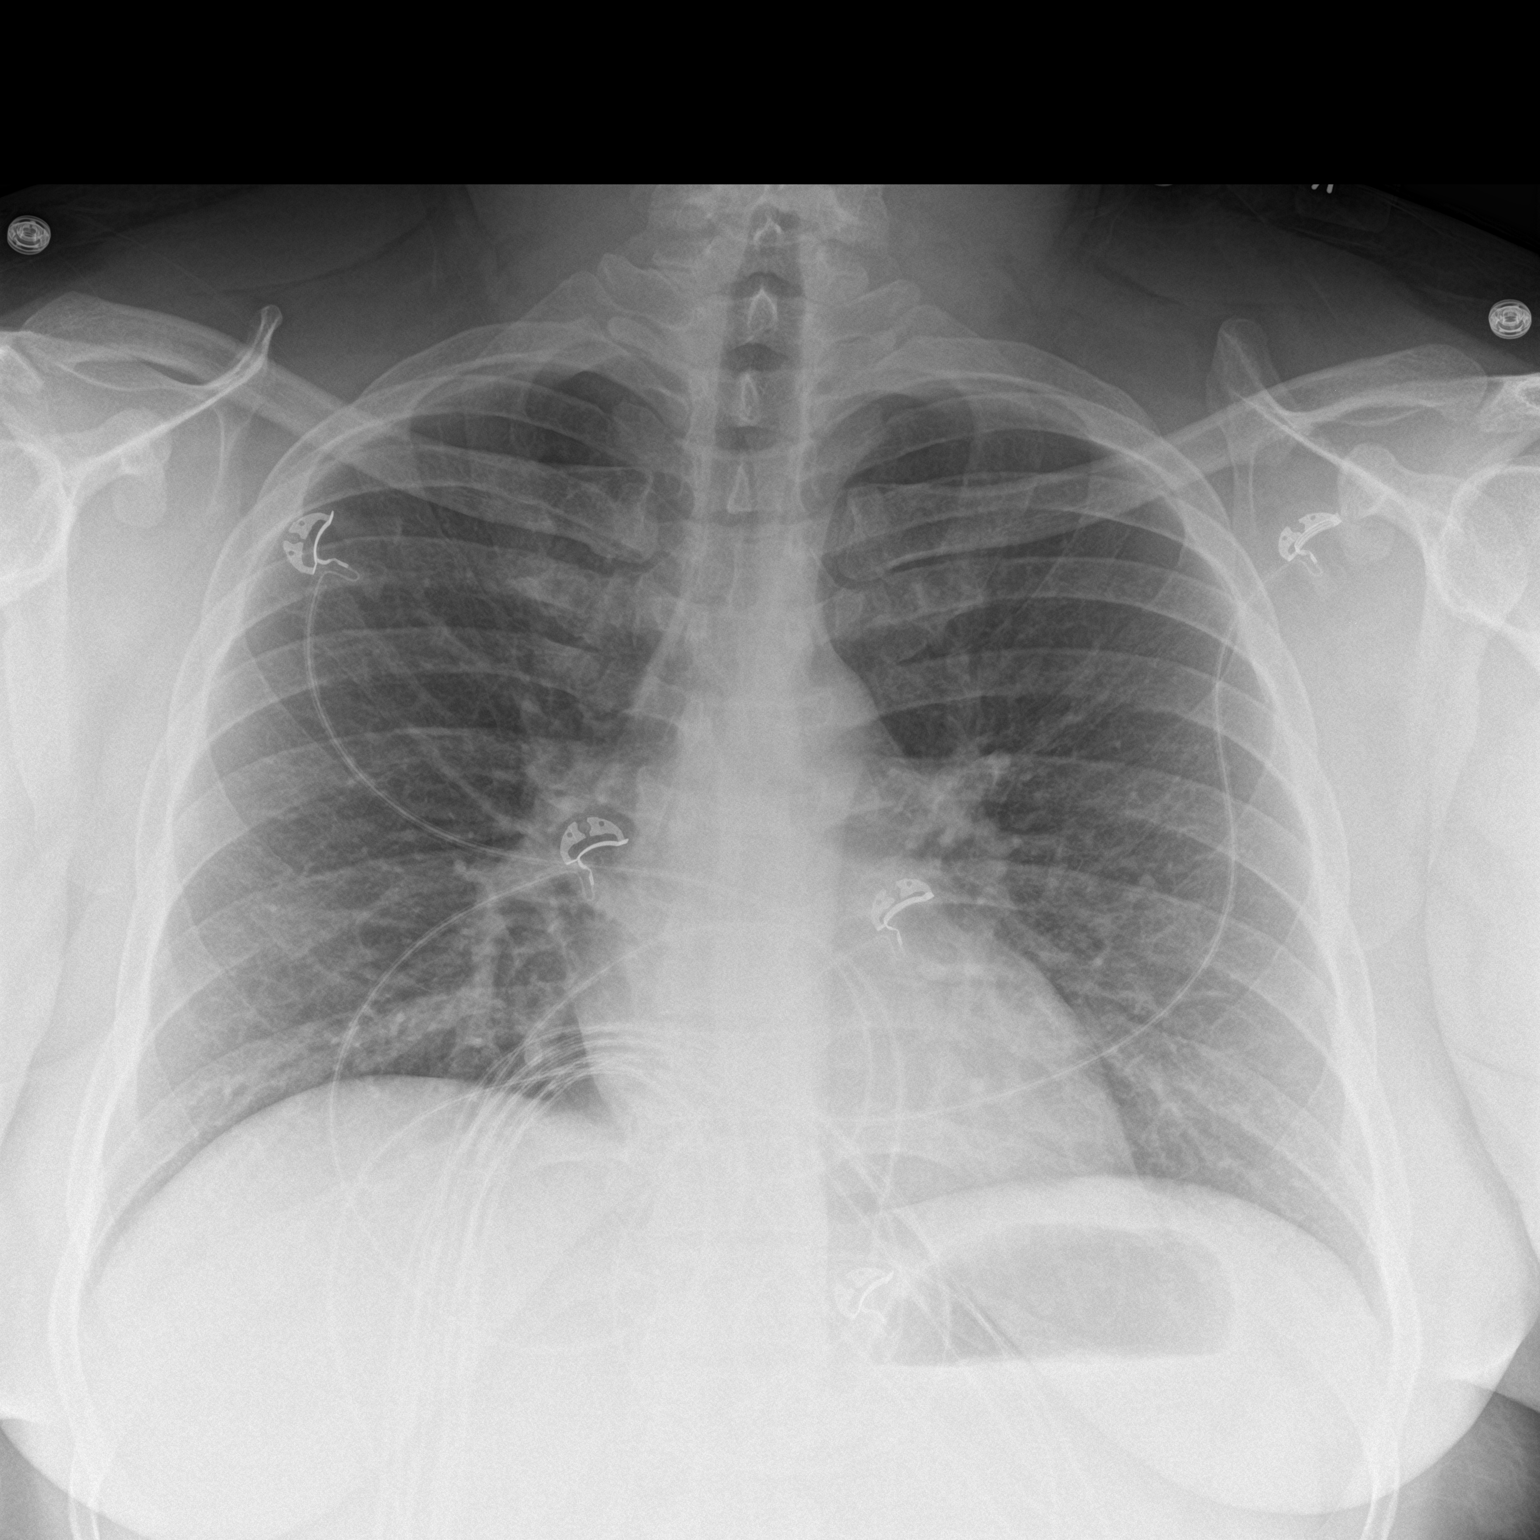

[chest lat]
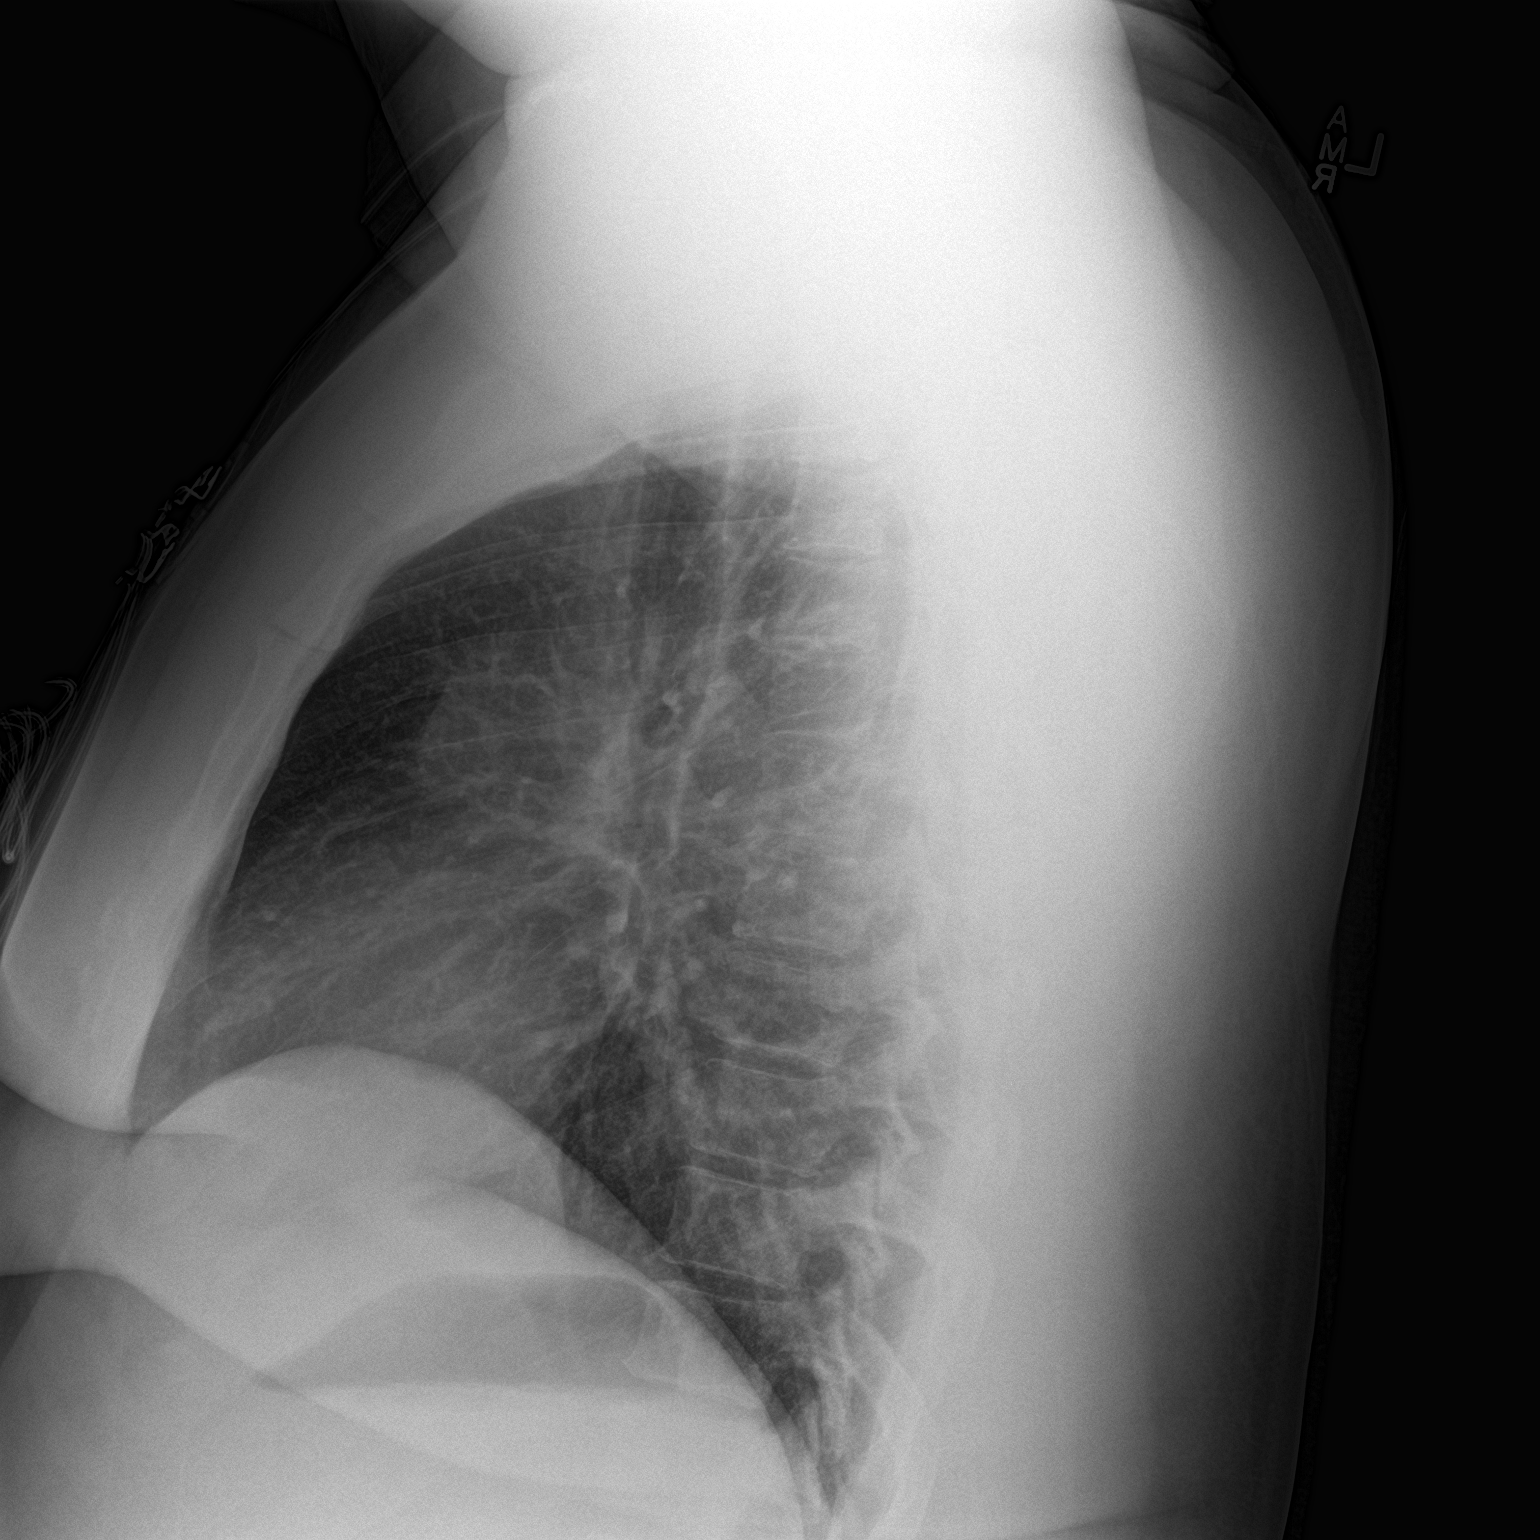

[2 of 2 positions shown; findings below may reference images not displayed]

FINDINGS: Normal heart size, mediastinal contours, and pulmonary vascularity.

Lungs clear.

No pneumothorax.

Bones unremarkable.
IMPRESSION: Normal exam.

## 2017-09-30 DIAGNOSIS — R7303 Prediabetes: Secondary | ICD-10-CM | POA: Diagnosis not present

## 2017-09-30 DIAGNOSIS — K760 Fatty (change of) liver, not elsewhere classified: Secondary | ICD-10-CM | POA: Diagnosis not present

## 2017-11-21 ENCOUNTER — Encounter: Payer: Self-pay | Admitting: Nutrition

## 2017-11-21 ENCOUNTER — Encounter: Payer: 59 | Attending: Internal Medicine | Admitting: Nutrition

## 2017-11-21 DIAGNOSIS — R7303 Prediabetes: Secondary | ICD-10-CM | POA: Insufficient documentation

## 2017-11-21 DIAGNOSIS — R739 Hyperglycemia, unspecified: Secondary | ICD-10-CM

## 2017-11-21 DIAGNOSIS — Z713 Dietary counseling and surveillance: Secondary | ICD-10-CM | POA: Insufficient documentation

## 2017-11-21 NOTE — Patient Instructions (Addendum)
Goals 1 Follow My Plate 2. Cut out junk food, sweets, soda, juice. Drink only water. Cut out snacks 3 Shop better with sales ad. 4. Try to limit eating out once a week. Lose 2-3 lbs per week

## 2017-11-21 NOTE — Progress Notes (Signed)
  Medical Nutrition Therapy:  Appt start time: 0830 end time:  0930.   Assessment:  Primary concerns today: Prediabetes and  Obesity-BMI >50.Kyle Nguyen LIves with is wife and 5 kids. Work Medical laboratory scientific officer at [employer].Kyle Nguyen He swings shift day and night.  He would like to lose 100-120 lbs. This is the highest weight he has weighed.  Admits to eating more fast and convenient foods and meals. He has tried to [employer] weigh a year ago and lost about 30-40 lbs. Kyle Nguyen He joined a gym and working out 4-5 times per week. Got rid of sweets and eating healthier foods. Had gotten down to 330 lbs.  A1C 5.9%.  Eating 3 meals per day. Works 12 hr shift; usually takes left overs to work. Eats out 4-5 times per week. Drinks a lot of soda, tea and sweet beverages.   He is engaged and willing to make changes with his diet and exercise to lose weight and prevent diabetes.  Current diet is high in fat, salt and excessive in calories. Diet is low in fresh fruits and vegetables.  Preferred Learning Style:  No preference indicated   Learning Readiness:   Ready  Change in progress   MEDICATIONS:    DIETARY INTAKE:    24-hr recall:  B ( AM): skps breakfast;  Cereal -frosted flakes, or eggs and bacon, water Snk ( AM):   L ( PM): Hot dog hamburger and salad with ranch dressing, water Snk ( PM): Reeces candy and soda, D ( PM): CHicken parmesean, noodles, soda,  Snk ( PM): cereal sometimes. Beverages: soda, water  Usual physical activity: ADL  Estimated energy needs: 1800  calories 200  g carbohydrates 135 g protein 59 g fat  Progress Towards Goal(s):  In progress.   Nutritional Diagnosis:  NB-1.1 Food and nutrition-related knowledge deficit As related to Prediabetes, Morbid Obesity.  As evidenced by BMI >50 and A1C 5.7%..    Intervention:  Nutrition and Diabetes education provided on My Plate, CHO counting, meal planning, portion sizes, timing of meals, avoiding snacks between meals unless having a low blood sugar,  target ranges for A1C and blood sugars, signs/symptoms and treatment of hyper/hypoglycemia, monitoring blood sugars, taking medications as prescribed, benefits of exercising 30 minutes per day and prevention of complications of DM.  Goals 1 Follow My Plate 2. Cut out junk food, sweets, soda, juice. Drink only water. Cut out snacks 3 Shop better with sales ad. 4. Try to limit eating out once a week. Lose 2-3 lbs per week Teaching Method Utilized :  Visual Auditory Hands on  Handouts given during visit include:  The Plate Method   Meal Plan Card   Barriers to learning/adherence to lifestyle change: none  Demonstrated degree of understanding via:  Teach Back   Monitoring/Evaluation:  Dietary intake, exercise, none, and body weight in 1 month(s).

## 2017-11-22 ENCOUNTER — Encounter: Payer: Self-pay | Admitting: Nutrition

## 2017-12-14 ENCOUNTER — Encounter: Payer: Self-pay | Admitting: Nutrition

## 2018-01-02 DIAGNOSIS — R7303 Prediabetes: Secondary | ICD-10-CM | POA: Diagnosis not present

## 2018-01-24 ENCOUNTER — Encounter: Payer: 59 | Attending: Internal Medicine | Admitting: Nutrition

## 2018-01-24 ENCOUNTER — Encounter: Payer: Self-pay | Admitting: Nutrition

## 2018-01-24 DIAGNOSIS — Z6841 Body Mass Index (BMI) 40.0 and over, adult: Secondary | ICD-10-CM | POA: Insufficient documentation

## 2018-01-24 DIAGNOSIS — R739 Hyperglycemia, unspecified: Secondary | ICD-10-CM

## 2018-01-24 DIAGNOSIS — Z713 Dietary counseling and surveillance: Secondary | ICD-10-CM | POA: Diagnosis not present

## 2018-01-24 DIAGNOSIS — R7303 Prediabetes: Secondary | ICD-10-CM | POA: Diagnosis not present

## 2018-01-24 NOTE — Patient Instructions (Addendum)
Goals 1. Continue to follow the My Plate 2. Be consistent with meals and planning 3. Increase excise 60 minutes 3-4 times per week adding so me cardio 4. Lose 1-2 lbs per week Get labs done and get A1C to less than 5.7%.

## 2018-01-24 NOTE — Progress Notes (Signed)
  Medical Nutrition Therapy:  Appt start time: 1030 end time:  1100  Assessment:  Primary concerns today: Prediabetes and  Obesity-BMI >50.Marland Kitchen LIves with is wife and 5 kids. 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   Changes made: Eating a lot more greens. Has been focusing more grilled and baked meats. Has been more mindful of his emotions with eating. Planning meals better and taking food to work. Eating meals on schedule. Feels much better. Lost 6 lbs.  He is engaged and willing to make changes with his diet and exercise to lose weight and prevent diabetes.  Current diet is high in fat, salt and excessive in calories. Diet is low in fresh fruits and vegetables.  Preferred Learning Style:  No preference indicated   Learning Readiness:   Ready  Change in progress   MEDICATIONS:    DIETARY INTAKE:    24-hr recall:  B ( AM): skps breakfast;  Cereal -frosted flakes, or eggs and bacon, water Snk ( AM):   L ( PM): Hot dog hamburger and salad with ranch dressing, water Snk ( PM): Reeces candy and soda, D ( PM): CHicken parmesean, noodles, soda,  Snk ( PM): cereal sometimes. Beverages: soda, water  Usual physical activity: ADL  Estimated energy needs: 1800  calories 200  g carbohydrates 135 g protein 59 g fat  Progress Towards Goal(s):  In progress.   Nutritional Diagnosis:  NB-1.1 Food and nutrition-related knowledge deficit As related to Prediabetes, Morbid Obesity.  As evidenced by BMI >50 and A1C 5.7%..    Intervention:  Nutrition and Diabetes education provided on My Plate, CHO counting, meal planning, portion sizes, timing of meals, avoiding snacks between meals unless having a low blood sugar, target ranges for A1C and blood sugars, signs/symptoms and treatment of hyper/hypoglycemia, monitoring blood sugars, taking medications as prescribed, benefits of exercising 30 minutes per day and prevention of complications of DM.  Goals 1. Continue to  follow the My Plate 2. Be consistent with meals and planning 3. Increase excise 60 minutes 3-4 times per week adding so me cardio 4. Lose 1-2 lbs per week Get labs done and get A1C to less than 5.7%. :  Visual Auditory Hands on  Handouts given during visit include:  The Plate Method   Meal Plan Card   Barriers to learning/adherence to lifestyle change: none  Demonstrated degree of understanding via:  Teach Back   Monitoring/Evaluation:  Dietary intake, exercise, none, and body weight in 3 month(s).

## 2018-04-25 ENCOUNTER — Ambulatory Visit: Payer: 59 | Admitting: Nutrition

## 2019-06-09 ENCOUNTER — Emergency Department (HOSPITAL_COMMUNITY)
Admission: EM | Admit: 2019-06-09 | Discharge: 2019-06-09 | Disposition: A | Discharge status: Home / Self Care | Payer: 59 | Attending: Emergency Medicine | Admitting: Emergency Medicine

## 2019-06-09 ENCOUNTER — Emergency Department (HOSPITAL_COMMUNITY): Payer: 59

## 2019-06-09 ENCOUNTER — Encounter (HOSPITAL_COMMUNITY): Payer: Self-pay | Admitting: Emergency Medicine

## 2019-06-09 ENCOUNTER — Other Ambulatory Visit: Payer: Self-pay

## 2019-06-09 DIAGNOSIS — R0602 Shortness of breath: Secondary | ICD-10-CM | POA: Diagnosis present

## 2019-06-09 DIAGNOSIS — R112 Nausea with vomiting, unspecified: Secondary | ICD-10-CM | POA: Diagnosis not present

## 2019-06-09 DIAGNOSIS — U071 COVID-19: Secondary | ICD-10-CM | POA: Diagnosis not present

## 2019-06-09 MED ORDER — ONDANSETRON HCL 4 MG PO TABS: 4.0000 mg | ORAL_TABLET | Freq: Four times a day (QID) | ORAL | 0 refills | Status: DC

## 2019-06-09 MED ORDER — ONDANSETRON 4 MG PO TBDP
4.0000 mg | ORAL_TABLET | Freq: Once | ORAL | Status: AC
  Administered 2019-06-09: 4 mg via ORAL
  Filled 2019-06-09: qty 1

## 2019-06-09 MED ORDER — ONDANSETRON HCL 4 MG PO TABS: 4.0000 mg | ORAL_TABLET | Freq: Three times a day (TID) | ORAL | 0 refills | Status: DC | PRN

## 2019-06-09 NOTE — ED Triage Notes (Signed)
Patient is COVID positive. Patient found out on Friday. Patient brought in by EMS. Patient has had increased coughing with nausea, vomiting, diarrhea. Patient also states he has had a fever.

## 2019-06-12 NOTE — ED Provider Notes (Signed)
[facility] EMERGENCY DEPARTMENT Provider Note   CSN: [id number] Arrival date & time: 06/09/19  2136     History   Chief Complaint Chief Complaint  Patient presents with  . Shortness of Breath    nausea, vomiting    HPI Mareon Viviano is a 38 y.o. male.     HPI   38 year old male with dyspnea and coughing.  Also nausea and vomiting.  Patient reports he is Covid positive.  States that is diagnosed this past Friday.  Subjective fevers.  No diarrhea.  Past Medical History:  Diagnosis Date  . Obesity     There are no active problems to display for this patient.   History reviewed. No pertinent surgical history.      Home Medications    Prior to Admission medications   Medication Sig Start Date End Date Taking? Authorizing Provider  diphenoxylate-atropine (LOMOTIL) 2.5-0.025 MG tablet Take 2 tablets by mouth 4 (four) times daily as needed for diarrhea or loose stools. Patient not taking: Reported on 11/21/2017 07/29/15   Orpah Greek, MD  ondansetron (ZOFRAN) 4 MG tablet Take 1 tablet (4 mg total) by mouth every 8 (eight) hours as needed for nausea or vomiting. 06/09/19   Virgel Manifold, MD  ondansetron (ZOFRAN) 4 MG tablet Take 1 tablet (4 mg total) by mouth every 6 (six) hours. 06/09/19   Virgel Manifold, MD    Family History Family History  Problem Relation Age of Onset  . Hypertension Mother     Social History Social History   Tobacco Use  . Smoking status: Never Smoker  . Smokeless tobacco: Never Used  Substance Use Topics  . Alcohol use: Yes    Comment: occasionally  . Drug use: No     Allergies   Patient has no known allergies.   Review of Systems Review of Systems  All systems reviewed and negative, other than as noted in HPI.  Physical Exam Updated Vital Signs BP 116/65   Pulse 77   Temp 98.5 F (36.9 C) (Oral)   Resp 16   Ht 6' (1.829 m)   Wt (!) 170.1 kg   SpO2 92%   BMI 50.86 kg/m   Physical Exam Vitals signs and  nursing note reviewed.  Constitutional:      General: He is not in acute distress.    Appearance: He is well-developed. He is obese.  HENT:     Head: Normocephalic and atraumatic.  Eyes:     General:        Right eye: No discharge.        Left eye: No discharge.     Conjunctiva/sclera: Conjunctivae normal.  Neck:     Musculoskeletal: Neck supple.  Cardiovascular:     Rate and Rhythm: Normal rate and regular rhythm.     Heart sounds: Normal heart sounds. No murmur. No friction rub. No gallop.   Pulmonary:     Effort: Pulmonary effort is normal. No respiratory distress.     Breath sounds: Normal breath sounds.  Abdominal:     General: There is no distension.     Palpations: Abdomen is soft.     Tenderness: There is no abdominal tenderness.  Musculoskeletal:        General: No tenderness.  Skin:    General: Skin is warm and dry.  Neurological:     Mental Status: He is alert.  Psychiatric:        Behavior: Behavior normal.  Thought Content: Thought content normal.      ED Treatments / Results  Labs (all labs ordered are listed, but only abnormal results are displayed) Labs Reviewed - No data to display  EKG EKG Interpretation  Date/Time:  Saturday June 09 2019 21:53:47 EST Ventricular Rate:  79 PR Interval:    QRS Duration: 102 QT Interval:  366 QTC Calculation: 420 R Axis:   -55 Text Interpretation: Sinus rhythm Left anterior fascicular block Abnormal R-wave progression, late transition Confirmed by Pryor Curia [phone]) on 06/12/2019 11:29:36 AM   Radiology No results found.  Procedures Procedures (including critical care time)  Medications Ordered in ED Medications  ondansetron (ZOFRAN-ODT) disintegrating tablet 4 mg (4 mg Oral Given 06/09/19 2222)     Initial Impression / Assessment and Plan / ED Course  I have reviewed the triage vital signs and the nursing notes.  Pertinent labs & imaging results that were available during my care of the  patient were reviewed by me and considered in my medical decision making (see chart for details).        38 year old male reports that he tested positive for Covid on Friday.  Symptoms for several days prior to this though.  Primary complaint is nausea/vomiting.  Nausea much improved after dose of Zofran.  Plan continue symptomatic treatment.  His complaint of cough and some mild dyspnea.  He generally appears well on exam though.  Oxygen saturations are mildly low on room air but not to the point that I feel he requires hospitalization.  Strict return precautions were discussed.  Final Clinical Impressions(s) / ED Diagnoses   Final diagnoses:  [id number] virus infection  Nausea and vomiting, intractability of vomiting not specified, unspecified vomiting type    ED Discharge Orders         Ordered    ondansetron (ZOFRAN) 4 MG tablet  Every 8 hours PRN     06/09/19 2257    ondansetron (ZOFRAN) 4 MG tablet  Every 6 hours     06/09/19 2257           Virgel Manifold, MD 06/12/19 1139

## 2021-03-04 ENCOUNTER — Ambulatory Visit (INDEPENDENT_AMBULATORY_CARE_PROVIDER_SITE_OTHER): Payer: 59 | Admitting: Student

## 2021-03-04 VITALS — BP 155/86 | HR 68 | Temp 98.4°F | Ht 72.0 in | Wt 396.4 lb

## 2021-03-04 DIAGNOSIS — R030 Elevated blood-pressure reading, without diagnosis of hypertension: Secondary | ICD-10-CM

## 2021-03-04 DIAGNOSIS — E8881 Metabolic syndrome: Secondary | ICD-10-CM

## 2021-03-04 DIAGNOSIS — Z0000 Encounter for general adult medical examination without abnormal findings: Secondary | ICD-10-CM

## 2021-03-04 DIAGNOSIS — E782 Mixed hyperlipidemia: Secondary | ICD-10-CM

## 2021-03-04 DIAGNOSIS — R7303 Prediabetes: Secondary | ICD-10-CM

## 2021-03-04 DIAGNOSIS — D239 Other benign neoplasm of skin, unspecified: Secondary | ICD-10-CM

## 2021-03-04 DIAGNOSIS — L918 Other hypertrophic disorders of the skin: Secondary | ICD-10-CM

## 2021-03-04 NOTE — Patient Instructions (Signed)
Mr. Kyle Nguyen it was a pleasure meeting you today.   Regarding the weight keep up the number of steps you are getting. We discussed changing your diet. You stated your goal for the next time I see you is to be more consistent with your eating habits. Specifically you will make healthier options when getting something quick to eat.   We will see you back in 2-3 months to see how things are going.   Please let me know if you have any questions or concerns.

## 2021-03-05 LAB — BMP8+ANION GAP
Anion Gap: 14 mmol/L (ref 10.0–18.0)
BUN/Creatinine Ratio: 14 (ref 9–20)
BUN: 13 mg/dL (ref 6–20)
CO2: 23 mmol/L (ref 20–29)
Calcium: 9.7 mg/dL (ref 8.7–10.2)
Chloride: 101 mmol/L (ref 96–106)
Creatinine, Ser: 0.95 mg/dL (ref 0.76–1.27)
Glucose: 107 mg/dL — ABNORMAL HIGH (ref 65–99)
Potassium: 4.3 mmol/L (ref 3.5–5.2)
Sodium: 138 mmol/L (ref 134–144)
eGFR: 105 mL/min/{1.73_m2} (ref >59)

## 2021-03-05 LAB — LIPID PANEL
Chol/HDL Ratio: 6.2 ratio — ABNORMAL HIGH (ref 0.0–5.0)
Cholesterol, Total: 186 mg/dL (ref 100–199)
HDL: 30 mg/dL — ABNORMAL LOW (ref >39)
LDL Chol Calc (NIH): 135 mg/dL — ABNORMAL HIGH (ref 0–99)
Triglycerides: 114 mg/dL (ref 0–149)
VLDL Cholesterol Cal: 21 mg/dL (ref 5–40)

## 2021-03-05 LAB — HEPATITIS C ANTIBODY: Hep C Virus Ab: <0.1 s/co ratio (ref 0.0–0.9)

## 2021-03-05 LAB — HIV ANTIBODY (ROUTINE TESTING W REFLEX): HIV Screen 4th Generation wRfx: NONREACTIVE

## 2021-03-05 LAB — HEMOGLOBIN A1C
Est. average glucose Bld gHb Est-mCnc: 134 mg/dL
Hgb A1c MFr Bld: 6.3 % — ABNORMAL HIGH (ref 4.8–5.6)

## 2021-03-06 ENCOUNTER — Encounter: Payer: Self-pay | Admitting: Student

## 2021-03-06 DIAGNOSIS — E785 Hyperlipidemia, unspecified: Secondary | ICD-10-CM | POA: Insufficient documentation

## 2021-03-06 DIAGNOSIS — R7303 Prediabetes: Secondary | ICD-10-CM | POA: Insufficient documentation

## 2021-03-06 DIAGNOSIS — R030 Elevated blood-pressure reading, without diagnosis of hypertension: Secondary | ICD-10-CM | POA: Insufficient documentation

## 2021-03-06 DIAGNOSIS — Z0000 Encounter for general adult medical examination without abnormal findings: Secondary | ICD-10-CM | POA: Insufficient documentation

## 2021-03-06 DIAGNOSIS — E8881 Metabolic syndrome: Secondary | ICD-10-CM | POA: Insufficient documentation

## 2021-03-06 DIAGNOSIS — D239 Other benign neoplasm of skin, unspecified: Secondary | ICD-10-CM | POA: Insufficient documentation

## 2021-03-06 DIAGNOSIS — Z6841 Body Mass Index (BMI) 40.0 and over, adult: Secondary | ICD-10-CM | POA: Insufficient documentation

## 2021-03-06 NOTE — Progress Notes (Signed)
   CC: Re-establish care   HPI:  [name] is a 40 y.o. M with class 3 obesity and prediabetes.   Please see assessment and plan under notes tab for further details.    Past Medical History:  Diagnosis Date   Obesity   Prediabetes  PSH: None  FH: Mom HTN  SH: Works for [employer]  Non smoker Drinks 1-2 drinks/month   Allergies: NKDA  Review of Systems:  Please see assessment and plan under notes tab for further details.    Physical Exam:  Vitals:   03/04/21 0849 03/04/21 0905 03/04/21 0934  BP: (!) 150/93 (!) 151/90 (!) 155/86  Pulse: 76 69 68  Temp: 98.4 F (36.9 C)    TempSrc: Oral    SpO2: 97%    Weight: (!) 396 lb 6.4 oz (179.8 kg)    Height: 6' (1.829 m)     Gen: NAD CV: RRR, no murmurs Pulm: Non labored breathing on RA, no wheezing or crackles Abd: Soft, obese, NT, ND Ext: No edema of BLE, 2 cm in diameter skin colored, well circumscribed lesion pretibial LLE, Non TTP, firm, no drainage.   Assessment & Plan:   See Encounters Tab for problem based charting.  Patient seen with Dr. Evette Doffing

## 2021-03-06 NOTE — Assessment & Plan Note (Signed)
Patient with elevated blood pressure this clinic visit on multiple readings. Discussed with him checking his blood pressure at home and the role weight loss may play in his weight loss. He will follow up in 2 months.

## 2021-03-06 NOTE — Assessment & Plan Note (Signed)
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 It is not uncomfortable to the patient, he denies pruritus, drainage, increasing size, or changes in color. On exam lesion is firm to touch, non TTP, 2-3 cm in diameter raised, well circumscribed and raised. Reiterated that lesion is non cancerous.  He would however like the lesion removed.  -Patient referred to dermatology for removal of lesion.

## 2021-03-06 NOTE — Assessment & Plan Note (Signed)
Patient's HIV and Hep C were negative this clinic visit.   He has received the [place] at 2 doses.   Will discuss Tetanus vaccine at next clinic visit.

## 2021-03-06 NOTE — Assessment & Plan Note (Signed)
Patient reports that he was previously diagnosed with prediabetes and he and his PCP at that time discussed weight loss. Patient reports during the pandemic this has been difficult. He has gained about 20lbs over 2 years. His A1c this clinic visit is 6.3. Discussed with patient that weight loss would be beneficial for his elevated blood glucose. He states he gets about 12k steps at work but notes his eating habits could be improved. He specifically states that he would like to get healthier options even when he is pressed for time instead of [employer]. He also stated that he would try to get 15K steps per day. He feels once his job is less hectic as he is now working 12 hours (8pm-8am) he feels this will also help with what he is able to do in terms of cooking and physical activity. Also discussed with patient that given his weight, prediabetes and elevated blood pressure on this clinic visit we may also need to consider medications in the near future to prevent further comorbidities. He states that he understood.  -Patient will follow up in 2 m to check his progress with weight loss and to recheck his blood pressure.

## 2021-03-06 NOTE — Assessment & Plan Note (Signed)
Patient has elevated LDL and decreased HDL. His dyslipidemia is in the setting of his obesity. Discussed weight loss as previously mentioned and recommended patient start fish oil.  -Will recheck his lipid after exercise/weight loss and fish oil

## 2021-03-10 NOTE — Progress Notes (Signed)
Internal Medicine Clinic Attending   I saw and evaluated the patient.  I personally confirmed the key portions of the history and exam documented by Dr. Carter and I reviewed pertinent patient test results.  The assessment, diagnosis, and plan were formulated together and I agree with the documentation in the resident's note.  

## 2022-11-11 ENCOUNTER — Encounter: Payer: Self-pay | Admitting: Podiatry

## 2022-11-11 ENCOUNTER — Ambulatory Visit (INDEPENDENT_AMBULATORY_CARE_PROVIDER_SITE_OTHER): Payer: 59 | Admitting: Podiatry

## 2022-11-11 DIAGNOSIS — B351 Tinea unguium: Secondary | ICD-10-CM

## 2022-11-11 NOTE — Progress Notes (Signed)
  Subjective:  Patient ID: Kyle Nguyen, male    DOB: Jan 05, 1981,  MRN: [id number]  Chief Complaint  Patient presents with   Nail Problem    NP- Issue with toe nail. Left second toenail is thicker and becoming painful    42 y.o. male presents with the above complaint. History confirmed with patient.   Objective:  Physical Exam: warm, good capillary refill, no trophic changes or ulcerative lesions, normal DP and PT pulses, normal sensory exam, and second toe elongated there is dystrophy of the second toenail with yellow discoloration and subungual debris and thickening.   Assessment:   1. Onychomycosis      Plan:  Patient was evaluated and treated and all questions answered.  We discussed multiple etiologies of onychodystrophy including possible fungal infection as well as pressure related to elongation of the second toe.  Recommend a culture of the nail plate and this was taken.  The results of this we will treat accordingly.  If fungal involvement present likely will treat with Lamisil p.o.,  If not can continue with regular nail maintenance with pedicures versus permanent total matricectomy.  I will message him with the results on MyChart.  Return if symptoms worsen or fail to improve.

## 2022-12-07 ENCOUNTER — Encounter: Payer: Self-pay | Admitting: Podiatry

## 2023-02-01 ENCOUNTER — Encounter: Payer: Self-pay | Admitting: Podiatry

## 2023-12-15 ENCOUNTER — Telehealth: Payer: Self-pay | Admitting: Student

## 2023-12-15 NOTE — Telephone Encounter (Signed)
 Patient was contacted via telephone today due to appointment scheduled on 12/20/23 had to be rescheduled.  No answer, but was able to leave detailed message with new appointment date and time on 12/22/23 at 10:15 am with Dr. Dorthy Gavia.

## 2023-12-20 ENCOUNTER — Ambulatory Visit: Payer: Self-pay | Admitting: Dietician

## 2023-12-22 ENCOUNTER — Other Ambulatory Visit: Payer: Self-pay | Admitting: Internal Medicine

## 2023-12-22 ENCOUNTER — Ambulatory Visit: Admitting: Student

## 2023-12-22 ENCOUNTER — Telehealth: Payer: Self-pay | Admitting: *Deleted

## 2023-12-22 VITALS — BP 119/60 | HR 79 | Temp 98.4°F | Ht 71.0 in | Wt >= 6400 oz

## 2023-12-22 DIAGNOSIS — Z6841 Body Mass Index (BMI) 40.0 and over, adult: Secondary | ICD-10-CM

## 2023-12-22 DIAGNOSIS — E785 Hyperlipidemia, unspecified: Secondary | ICD-10-CM

## 2023-12-22 DIAGNOSIS — R7303 Prediabetes: Secondary | ICD-10-CM | POA: Diagnosis not present

## 2023-12-22 DIAGNOSIS — R030 Elevated blood-pressure reading, without diagnosis of hypertension: Secondary | ICD-10-CM | POA: Diagnosis not present

## 2023-12-22 DIAGNOSIS — E782 Mixed hyperlipidemia: Secondary | ICD-10-CM

## 2023-12-22 DIAGNOSIS — Z0000 Encounter for general adult medical examination without abnormal findings: Secondary | ICD-10-CM

## 2023-12-22 LAB — POCT GLYCOSYLATED HEMOGLOBIN (HGB A1C): Hemoglobin A1C: 6.4 % — AB (ref 4.0–5.6)

## 2023-12-22 LAB — GLUCOSE, CAPILLARY: Glucose-Capillary: 181 mg/dL — ABNORMAL HIGH (ref 70–99)

## 2023-12-22 MED ORDER — METFORMIN HCL ER 500 MG PO TB24: ORAL_TABLET | ORAL | 0 refills | Status: DC

## 2023-12-22 NOTE — Progress Notes (Signed)
 CC: routine follow up   HPI:  Kyle Nguyen is a 43 y.o. male living with a history stated below and presents today for the chief complaint stated above. Please see problem based assessment and plan for additional details.  Past Medical History:  Diagnosis Date   Obesity    Family History  Problem Relation Age of Onset   Hypertension Mother    Social History   Socioeconomic History   Marital status: Married    Spouse name: Not on file   Number of children: Not on file   Years of education: Not on file   Highest education level: Associate degree: occupational, Scientist, product/process development, or vocational program  Occupational History   Not on file  Tobacco Use   Smoking status: Never   Smokeless tobacco: Never  Vaping Use   Vaping status: Never Used  Substance and Sexual Activity   Alcohol use: Yes    Comment: occasionally   Drug use: No   Sexual activity: Not on file  Other Topics Concern   Not on file  Social History Narrative   Not on file   Social Drivers of Health   Financial Resource Strain: Low Risk  (12/22/2023)   Overall Financial Resource Strain (CARDIA)    Difficulty of Paying Living Expenses: Not very hard  Food Insecurity: No Food Insecurity (12/22/2023)   Hunger Vital Sign    Worried About Running Out of Food in the Last Year: Never true    Ran Out of Food in the Last Year: Never true  Transportation Needs: No Transportation Needs (12/22/2023)   PRAPARE - Administrator, Civil Service (Medical): No    Lack of Transportation (Non-Medical): No  Physical Activity: Inactive (12/22/2023)   Exercise Vital Sign    Days of Exercise per Week: 0 days    Minutes of Exercise per Session: Not on file  Stress: No Stress Concern Present (12/22/2023)   [employer] of Occupational Health - Occupational Stress Questionnaire    Feeling of Stress: Not at all  Social Connections: Moderately Isolated (12/22/2023)   Social Connection and Isolation Panel    Frequency of  Communication with Friends and Family: Once a week    Frequency of Social Gatherings with Friends and Family: Never    Attends Religious Services: Never    Database administrator or Organizations: Yes    Attends Engineer, structural: More than 4 times per year    Marital Status: Married  Catering manager Violence: Not on file   Review of Systems: ROS negative except for what is noted on the assessment and plan.  Vitals:   12/22/23 1318  BP: 119/60  Pulse: 79  Temp: 98.4 F (36.9 C)  TempSrc: Oral  SpO2: 97%  Weight: (!) 411 lb 9.6 oz (186.7 kg)  Height: 5' 11 (1.803 m)   Physical Exam: Well-appearing, obese man, sitting in chair in no acute distress Heart rate and rhythm regular, no murmurs, euvolemic Lungs clear to auscultation bilaterally Alert and oriented x 3, no neurological deficits Normal muscular bulk and tone Skin changes on neck consistent with acanthosis nigricans  Assessment & Plan:   Patient discussed with Dr. Broadus Canes  Prediabetes Lab Results  Component Value Date   HGBA1C 6.4 (A) 12/22/2023   HGBA1C 6.3 (H) 03/04/2021   He was last seen here in 2022, at which time his Hgb A1c was 6.3%.  His hemoglobin A1c today is 6.4%. He does note polyuria; denies dysuria, abnormal urine  color, or discharge. He is not currently taking any medications for this.  Spoke with him about dietary modifications and medication management, and he would like to start metformin today.  We will taper up to 2000 mg daily.  Elevated blood pressure reading without diagnosis of hypertension BP Readings from Last 3 Encounters:  12/22/23 119/60  03/04/21 (!) 155/86  06/09/19 116/65    Blood pressure today is well-controlled.  He had 1 elevated blood pressure at a prior visit, however overall seems like his blood pressure has been within normal limits.  No new changes.  Hyperlipidemia Lipid Panel     Component Value Date/Time   CHOL 186 03/04/2021 0958   TRIG 114  03/04/2021 0958   HDL 30 (L) 03/04/2021 0958   CHOLHDL 6.2 (H) 03/04/2021 0958   LDLCALC 135 (H) 03/04/2021 0958   LABVLDL 21 03/04/2021 0958    Last lipid panel in 2022 showed LDL of 135, HDL 30.  He is not currently taking any medications. The 10-year ASCVD risk score (Arnett DK, et al., 2019) is: 3.3%.  We will recheck his lipid panel today.   Body mass index (BMI) 50.0-59.9, adult [facility]) Spoke with him about weight loss and the importance of this and his overall health.  He notes that he previously intended to make strides in weight loss with dieting and exercising, however never got around to doing so.  He cooks a lot of his meals at home, however these contain fair amount of fried and fatty foods.  I recommended he find ways to make healthier modifications in a sustainable way.  I think he could benefit from a referral to the healthy weight and wellness clinic for assistance with his weight loss plan, so I will send a referral to them.  Healthcare maintenance He came in today because he thought he was due for some cancer screenings.  He does not have any family history of any cancers, therefore, it is too early for any cancer screenings at this time.  He is not experiencing any concerning symptoms that would prompt further evaluation for this.  Dorthy Gavia, MD [facility] Internal Medicine, PGY-1 Phone: [phone] Date 12/22/2023 Time 2:19 PM

## 2023-12-22 NOTE — Assessment & Plan Note (Addendum)
 Lab Results  Component Value Date   HGBA1C 6.4 (A) 12/22/2023   HGBA1C 6.3 (H) 03/04/2021   He was last seen here in 2022, at which time his Hgb A1c was 6.3%.  His hemoglobin A1c today is 6.4%. He does note polyuria; denies dysuria, abnormal urine color, or discharge. He is not currently taking any medications for this.  Spoke with him about dietary modifications and medication management, and he would like to start metformin today.  We will taper up to 2000 mg daily.

## 2023-12-22 NOTE — Telephone Encounter (Signed)
 Copied from CRM [id number]. Topic: Clinical - Prescription Issue >> Dec 22, 2023  4:07 PM Retta Caster wrote: Reason for CRM:  metFORMIN (GLUCOPHAGE-XR) 500 MG 24 hr tablet-Benta from Pharmacy calling on clarification on this medication due to requesting 2000 mg. Need call back    Location:  [id number] - [place], [place] - [address] [place] [address] Phone: [phone] Fax: [phone] Hours: Not open 24 hours

## 2023-12-22 NOTE — Patient Instructions (Addendum)
 Thank you, [name] for allowing us  to provide your care today. Today we discussed your blood pressure, blood sugars, and weight.    Your blood pressure was great today.   Your hemoglobin A1c was 6.4%, which is only slightly increased from your previous lab in 2022, however is right on the borderline of diabetes.  We will start a medication called metformin today which will help with your blood sugars.    You can start by taking one 500 mg tablet daily with breakfast for a week, then take one 500 mg tablet twice daily with meals for a week, then take two 500 mg tablets with breakfast and 1 500 mg tablet with dinner for a week, then you will reach the target dose with two 500 mg tablets with breakfast and dinner to total 2000 mg.  If at any point you feel like the dose increase makes you feel bad, you can go back down to the previous dose and stay there.   As we discussed, weight loss will be an important aspect of optimizing your health.  Try to incorporate routines that you are able to easily do into your daily schedule.  I am glad that you are cooking meals at home.  Keep an eye out for ways to make these healthier in a sustainable way.  I also recommend finding a way to integrate walking into your daily routine.  I have ordered the following labs for you:  Lab Orders         Lipid Profile         Glucose, capillary         POC Hbg A1C      I will call you as soon as I have the results.   Referrals ordered today:   We will place a referral to the healthy weight and wellness clinic.  They should call you to schedule an appointment.   Follow up: 4-6 months   We look forward to seeing you next time. Please call our clinic at [phone] if you have any questions or concerns. The best time to call is Monday-Friday from 9am-4pm, but there is someone available 24/7. If after hours or the weekend, call the main hospital number and ask for the Internal Medicine Resident On-Call. If you need  medication refills, please notify your pharmacy one week in advance and they will send us  a request.   Thank you for trusting me with your care. Wishing you the best!   Dorthy Gavia, MD [facility]

## 2023-12-22 NOTE — Assessment & Plan Note (Addendum)
 BP Readings from Last 3 Encounters:  12/22/23 119/60  03/04/21 (!) 155/86  06/09/19 116/65    Blood pressure today is well-controlled.  He had 1 elevated blood pressure at a prior visit, however overall seems like his blood pressure has been within normal limits.  No new changes.

## 2023-12-22 NOTE — Assessment & Plan Note (Signed)
 He came in today because he thought he was due for some cancer screenings.  He does not have any family history of any cancers, therefore, it is too early for any cancer screenings at this time.  He is not experiencing any concerning symptoms that would prompt further evaluation for this.

## 2023-12-22 NOTE — Assessment & Plan Note (Addendum)
 Lipid Panel     Component Value Date/Time   CHOL 186 03/04/2021 0958   TRIG 114 03/04/2021 0958   HDL 30 (L) 03/04/2021 0958   CHOLHDL 6.2 (H) 03/04/2021 0958   LDLCALC 135 (H) 03/04/2021 0958   LABVLDL 21 03/04/2021 0958    Last lipid panel in 2022 showed LDL of 135, HDL 30.  He is not currently taking any medications. The 10-year ASCVD risk score (Arnett DK, et al., 2019) is: 3.3%.  We will recheck his lipid panel today.

## 2023-12-22 NOTE — Assessment & Plan Note (Signed)
 Spoke with him about weight loss and the importance of this and his overall health.  He notes that he previously intended to make strides in weight loss with dieting and exercising, however never got around to doing so.  He cooks a lot of his meals at home, however these contain fair amount of fried and fatty foods.  I recommended he find ways to make healthier modifications in a sustainable way.  I think he could benefit from a referral to the healthy weight and wellness clinic for assistance with his weight loss plan, so I will send a referral to them.

## 2023-12-23 LAB — LIPID PANEL
Chol/HDL Ratio: 5.8 ratio — ABNORMAL HIGH (ref 0.0–5.0)
Cholesterol, Total: 184 mg/dL (ref 100–199)
HDL: 32 mg/dL — ABNORMAL LOW (ref >39)
LDL Chol Calc (NIH): 123 mg/dL — ABNORMAL HIGH (ref 0–99)
Triglycerides: 159 mg/dL — ABNORMAL HIGH (ref 0–149)
VLDL Cholesterol Cal: 29 mg/dL (ref 5–40)

## 2023-12-23 MED ORDER — METFORMIN HCL 500 MG PO TABS: ORAL_TABLET | ORAL | 0 refills | Status: AC

## 2023-12-23 NOTE — Addendum Note (Signed)
 Addended by: Dorthy Gavia on: 12/23/2023 11:10 AM   Modules accepted: Orders

## 2023-12-26 ENCOUNTER — Ambulatory Visit: Payer: Self-pay | Admitting: Student

## 2023-12-29 NOTE — Progress Notes (Signed)
 Internal Medicine Clinic Attending  Case discussed with the resident at the time of the visit.  We reviewed the resident's history and exam and pertinent patient test results.  I agree with the assessment, diagnosis, and plan of care documented in the resident's note.

## 2024-01-05 ENCOUNTER — Other Ambulatory Visit: Payer: Self-pay | Admitting: Student

## 2024-01-05 NOTE — Telephone Encounter (Signed)
 Copied from CRM [id number]. Topic: Clinical - Medication Refill >> Jan 05, 2024  9:00 AM Merlynn A wrote: Medication: metFORMIN  (GLUCOPHAGE ) 500 MG tablet  Has the patient contacted their pharmacy? No (Agent: If no, request that the patient contact the pharmacy for the refill. If patient does not wish to contact the pharmacy document the reason why and proceed with request.) (Agent: If yes, when and what did the pharmacy advise?)  Patient was advised by provider to call back within 2 weeks to have dosage increased to 1000mg . Patient will be traveling soon and wanted to call back to request refill before travel.   This is the patient's preferred pharmacy:  [facility] [address] [place], [address] [phone]  Is this the correct pharmacy for this prescription? Yes If no, delete pharmacy and type the correct one.   Has the prescription been filled recently? Yes  Is the patient out of the medication? NO  Has the patient been seen for an appointment in the last year OR does the patient have an upcoming appointment? Yes  Can we respond through MyChart? Yes  Agent: Please be advised that Rx refills may take up to 3 business days. We ask that you follow-up with your pharmacy.

## 2024-01-09 ENCOUNTER — Other Ambulatory Visit: Payer: Self-pay | Admitting: Internal Medicine

## 2024-01-09 MED ORDER — METFORMIN HCL 1000 MG PO TABS: 1000.0000 mg | ORAL_TABLET | Freq: Two times a day (BID) | ORAL | 3 refills | Status: AC

## 2024-01-10 ENCOUNTER — Encounter (INDEPENDENT_AMBULATORY_CARE_PROVIDER_SITE_OTHER): Payer: Self-pay
